# Patient Record
Sex: Female | Born: 1970 | Race: Black or African American | Hispanic: No | Smoking: Former smoker
Health system: Southern US, Community
[De-identification: ages and names within clinical notes are randomized; demographics above are authoritative.]

## PROBLEM LIST (undated history)

## (undated) DIAGNOSIS — D259 Leiomyoma of uterus, unspecified: Secondary | ICD-10-CM

## (undated) DIAGNOSIS — R911 Solitary pulmonary nodule: Secondary | ICD-10-CM

## (undated) HISTORY — DX: Leiomyoma of uterus, unspecified: D25.9

## (undated) HISTORY — PX: CHOLECYSTECTOMY: SHX55

## (undated) HISTORY — DX: Solitary pulmonary nodule: R91.1

---

## 2019-07-21 ENCOUNTER — Other Ambulatory Visit: Payer: Self-pay

## 2019-07-21 ENCOUNTER — Emergency Department (HOSPITAL_BASED_OUTPATIENT_CLINIC_OR_DEPARTMENT_OTHER)
Admission: EM | Admit: 2019-07-21 | Discharge: 2019-07-21 | Disposition: A | Discharge status: Home / Self Care | Payer: BC Managed Care – PPO | Attending: Emergency Medicine | Admitting: Emergency Medicine

## 2019-07-21 ENCOUNTER — Encounter (HOSPITAL_BASED_OUTPATIENT_CLINIC_OR_DEPARTMENT_OTHER): Payer: Self-pay | Admitting: *Deleted

## 2019-07-21 ENCOUNTER — Emergency Department (HOSPITAL_BASED_OUTPATIENT_CLINIC_OR_DEPARTMENT_OTHER): Payer: BC Managed Care – PPO

## 2019-07-21 DIAGNOSIS — K5900 Constipation, unspecified: Secondary | ICD-10-CM | POA: Insufficient documentation

## 2019-07-21 DIAGNOSIS — F17200 Nicotine dependence, unspecified, uncomplicated: Secondary | ICD-10-CM | POA: Diagnosis not present

## 2019-07-21 DIAGNOSIS — D259 Leiomyoma of uterus, unspecified: Secondary | ICD-10-CM

## 2019-07-21 DIAGNOSIS — R102 Pelvic and perineal pain: Secondary | ICD-10-CM | POA: Insufficient documentation

## 2019-07-21 DIAGNOSIS — R1030 Lower abdominal pain, unspecified: Secondary | ICD-10-CM

## 2019-07-21 DIAGNOSIS — R1031 Right lower quadrant pain: Secondary | ICD-10-CM | POA: Diagnosis not present

## 2019-07-21 LAB — URINALYSIS, ROUTINE W REFLEX MICROSCOPIC
Bilirubin Urine: NEGATIVE
Glucose, UA: NEGATIVE mg/dL
Ketones, ur: 15 mg/dL — AB
Leukocytes,Ua: NEGATIVE
Nitrite: NEGATIVE
Protein, ur: NEGATIVE mg/dL
Specific Gravity, Urine: 1.025 (ref 1.005–1.030)
pH: 7.5 (ref 5.0–8.0)

## 2019-07-21 LAB — COMPREHENSIVE METABOLIC PANEL
ALT: 19 U/L (ref 0–44)
AST: 19 U/L (ref 15–41)
Albumin: 4.4 g/dL (ref 3.5–5.0)
Alkaline Phosphatase: 78 U/L (ref 38–126)
Anion gap: 10 (ref 5–15)
BUN: 14 mg/dL (ref 6–20)
CO2: 24 mmol/L (ref 22–32)
Calcium: 10.2 mg/dL (ref 8.9–10.3)
Chloride: 104 mmol/L (ref 98–111)
Creatinine, Ser: 1.05 mg/dL — ABNORMAL HIGH (ref 0.44–1.00)
GFR calc Af Amer: >60 mL/min (ref >60)
GFR calc non Af Amer: >60 mL/min (ref >60)
Glucose, Bld: 101 mg/dL — ABNORMAL HIGH (ref 70–99)
Potassium: 3.9 mmol/L (ref 3.5–5.1)
Sodium: 138 mmol/L (ref 135–145)
Total Bilirubin: 0.6 mg/dL (ref 0.3–1.2)
Total Protein: 8.2 g/dL — ABNORMAL HIGH (ref 6.5–8.1)

## 2019-07-21 LAB — WET PREP, GENITAL
Sperm: NONE SEEN
Trich, Wet Prep: NONE SEEN
Yeast Wet Prep HPF POC: NONE SEEN

## 2019-07-21 LAB — PREGNANCY, URINE: Preg Test, Ur: NEGATIVE

## 2019-07-21 LAB — CBC WITH DIFFERENTIAL/PLATELET
Abs Immature Granulocytes: 0.02 10*3/uL (ref 0.00–0.07)
Basophils Absolute: 0 10*3/uL (ref 0.0–0.1)
Basophils Relative: 0 %
Eosinophils Absolute: 0 10*3/uL (ref 0.0–0.5)
Eosinophils Relative: 0 %
HCT: 42.9 % (ref 36.0–46.0)
Hemoglobin: 13.8 g/dL (ref 12.0–15.0)
Immature Granulocytes: 0 %
Lymphocytes Relative: 27 %
Lymphs Abs: 1.7 10*3/uL (ref 0.7–4.0)
MCH: 29.2 pg (ref 26.0–34.0)
MCHC: 32.2 g/dL (ref 30.0–36.0)
MCV: 90.9 fL (ref 80.0–100.0)
Monocytes Absolute: 0.6 10*3/uL (ref 0.1–1.0)
Monocytes Relative: 10 %
Neutro Abs: 3.9 10*3/uL (ref 1.7–7.7)
Neutrophils Relative %: 63 %
Platelets: 270 10*3/uL (ref 150–400)
RBC: 4.72 MIL/uL (ref 3.87–5.11)
RDW: 13.1 % (ref 11.5–15.5)
WBC: 6.3 10*3/uL (ref 4.0–10.5)
nRBC: 0 % (ref 0.0–0.2)

## 2019-07-21 LAB — URINALYSIS, MICROSCOPIC (REFLEX)

## 2019-07-21 LAB — LIPASE, BLOOD: Lipase: 34 U/L (ref 11–51)

## 2019-07-21 MED ORDER — IOHEXOL 300 MG/ML  SOLN
100.0000 mL | Freq: Once | INTRAMUSCULAR | Status: AC | PRN
  Administered 2019-07-21: 100 mL via INTRAVENOUS

## 2019-07-21 NOTE — ED Notes (Signed)
Pt verbalized understanding of dc instructions.

## 2019-07-21 NOTE — ED Provider Notes (Signed)
[facility] Provider Note   CSN: [id number] Arrival date & time: 07/21/19  1222     History   Chief Complaint Chief Complaint  Patient presents with   Abdominal Pain    HPI Labelle Tremonti Roza is a 48 y.o. female.     Nikala Nghiem Vasey is a 48 y.o. female who is otherwise healthy, presents to the emergency department for evaluation of lower abdominal pain.  She started having some lower abdominal cramping 3 days ago this was associated with constipation, she initially took Gas-X without relief, then took a laxative and was able to have a bowel movement but cramping has persisted and has now become worse on the right side.  She reports some associated nausea but no vomiting.  No fevers or chills.  No burning or pain with urination, hematuria or flank pain.  She does report that back in July she had her normal menstrual cycle starting on the 17th, this lasted about a week and then went off for a few days and then she had a second menstrual cycle.  She reports that today she started to feel like her menstrual cycle was going to come back on but she does not usually have associated cramping or pain like she has been having.  She denies any vaginal discharge.  Has an IUD in place.  History of cholecystectomy but no other previous abdominal surgeries.  No other aggravating or alleviating factors.     History reviewed. No pertinent past medical history.  There are no active problems to display for this patient.   Past Surgical History:  Procedure Laterality Date   CESAREAN SECTION     CHOLECYSTECTOMY       OB History   No obstetric history on file.      Home Medications    Prior to Admission medications   Not on File    Family History No family history on file.  Social History Social History   Tobacco Use   Smoking status: Current Every Day Smoker   Smokeless tobacco: Never Used  Substance Use Topics   Alcohol use: Yes    Frequency: Never    Drug use: Never     Allergies   Patient has no known allergies.   Review of Systems Review of Systems  Constitutional: Negative for chills and fever.  HENT: Negative.   Respiratory: Negative for cough and shortness of breath.   Cardiovascular: Negative for chest pain.  Gastrointestinal: Positive for abdominal pain, constipation and nausea. Negative for blood in stool, diarrhea and vomiting.  Genitourinary: Negative for dysuria, flank pain, frequency, hematuria, vaginal bleeding and vaginal discharge.  Musculoskeletal: Negative for arthralgias and myalgias.  Skin: Negative for color change and rash.  Neurological: Negative for dizziness, syncope and light-headedness.     Physical Exam Updated Vital Signs BP (!) 137/96 (BP Location: Left Arm)    Pulse 74    Temp 99.4 F (37.4 C) (Oral)    Resp 14    Ht 5\' 5"  (1.651 m)    Wt 82.9 kg    SpO2 97%    BMI 30.42 kg/m   Physical Exam Vitals signs and nursing note reviewed. Exam conducted with a chaperone present.  Constitutional:      General: She is not in acute distress.    Appearance: She is well-developed and normal weight. She is not ill-appearing or diaphoretic.  HENT:     Head: Normocephalic and atraumatic.     Mouth/Throat:  Mouth: Mucous membranes are moist.     Pharynx: Oropharynx is clear.  Eyes:     General:        Right eye: No discharge.        Left eye: No discharge.     Pupils: Pupils are equal, round, and reactive to light.  Neck:     Musculoskeletal: Neck supple.  Cardiovascular:     Rate and Rhythm: Normal rate and regular rhythm.     Heart sounds: Normal heart sounds.  Pulmonary:     Effort: Pulmonary effort is normal. No respiratory distress.     Breath sounds: Normal breath sounds. No wheezing or rales.     Comments: Respirations equal and unlabored, patient able to speak in full sentences, lungs clear to auscultation bilaterally Abdominal:     General: Abdomen is flat. Bowel sounds are normal.  There is no distension.     Palpations: Abdomen is soft. There is no mass.     Tenderness: There is abdominal tenderness in the right lower quadrant and suprapubic area. There is no guarding.     Comments: Abdomen is soft, nondistended, bowel sounds present throughout, there is some tenderness in the suprapubic and right lower quadrants without guarding or rebound tenderness, no CVA tenderness bilaterally.  Genitourinary:    Comments: Chaperone present during pelvic exam. No external genital lesions noted. Speculum exam reveals small amount of clear discharge and very small amount of cervical bleeding, no lesions. On bimanual exam patient has some discomfort, palpable uterine fibroids present over the uterus, there is some tenderness to the right to, the uterus appears to be flexed slightly to the right and again uterine fibroids are palpable, no left adnexal tenderness. Musculoskeletal:        General: No deformity.  Skin:    General: Skin is warm and dry.     Capillary Refill: Capillary refill takes less than 2 seconds.  Neurological:     Mental Status: She is alert.     Coordination: Coordination normal.     Comments: Speech is clear, able to follow commands Moves extremities without ataxia, coordination intact  Psychiatric:        Mood and Affect: Mood normal.        Behavior: Behavior normal.      ED Treatments / Results  Labs (all labs ordered are listed, but only abnormal results are displayed) Labs Reviewed  URINALYSIS, ROUTINE W REFLEX MICROSCOPIC - Abnormal; Notable for the following components:      Result Value   Hgb urine dipstick LARGE (*)    Ketones, ur 15 (*)    All other components within normal limits  COMPREHENSIVE METABOLIC PANEL - Abnormal; Notable for the following components:   Glucose, Bld 101 (*)    Creatinine, Ser 1.05 (*)    Total Protein 8.2 (*)    All other components within normal limits  URINALYSIS, MICROSCOPIC (REFLEX) - Abnormal; Notable for  the following components:   Bacteria, UA FEW (*)    All other components within normal limits  WET PREP, GENITAL  LIPASE, BLOOD  CBC WITH DIFFERENTIAL/PLATELET  PREGNANCY, URINE    EKG None  Radiology Ct Abdomen Pelvis W Contrast  Result Date: 07/21/2019 CLINICAL DATA:  Abdominal pain with nausea EXAM: CT ABDOMEN AND PELVIS WITH CONTRAST TECHNIQUE: Multidetector CT imaging of the abdomen and pelvis was performed using the standard protocol following bolus administration of intravenous contrast. CONTRAST:  131mL OMNIPAQUE IOHEXOL 300 MG/ML  SOLN COMPARISON:  None.  FINDINGS: Lower chest: There is mild bibasilar atelectasis. There is a nodular opacity in the lateral segment of the left lower lobe abutting the pleura measuring 3 mm. This nodular opacity is seen on axial slice 9 series 3. Hepatobiliary: No focal liver lesions are apparent. Gallbladder is absent. There is no biliary duct dilatation. Pancreas: No pancreatic mass or inflammatory focus. Spleen: No splenic lesions are evident. Adrenals/Urinary Tract: Adrenals bilaterally appear normal. Kidneys bilaterally show no evident mass or hydronephrosis on either side. There is no evident renal or ureteral calculus on either side. Urinary bladder is midline with wall thickness within normal limits. Stomach/Bowel: There is no appreciable bowel wall or mesenteric thickening. No evident bowel obstruction. Terminal ileum appears normal. There is no evident free air or portal venous air. Vascular/Lymphatic: There is no abdominal aortic aneurysm. No vascular lesions are evident. There is a circumaortic left renal vein, an anatomic variant. There is no adenopathy in the abdomen or pelvis. Reproductive: The uterus is anteverted. There is an intrauterine device within the endometrium. There are multiple leiomyomas throughout the uterus, largest measuring 6.7 x 5.5 cm. No extrauterine pelvic masses are demonstrable. There is a physiologic follicle in the left  ovary measuring 1.2 x 1.2 cm. Other: Appendix appears normal. No evident abscess or ascites in the abdomen or pelvis. Musculoskeletal: There are no blastic or lytic bone lesions. No intramuscular or abdominal wall lesions are evident. IMPRESSION: 1. Enlarged leiomyomatous uterus. Intrauterine device positioned within the endometrium. Multiple leiomyomas are impressing upon the intrauterine device and endometrium. 2. Appendix appears normal. No abscess in the abdomen or pelvis. No bowel obstruction. 3. No renal or ureteral calculus. No hydronephrosis. Urinary bladder wall thickness is within normal limits. 4.  Gallbladder absent. 5. 3 mm nodular opacity in left lower lobe. No follow-up needed if patient is low-risk. Non-contrast chest CT can be considered in 12 months if patient is high-risk. This recommendation follows the consensus statement: Guidelines for Management of Incidental Pulmonary Nodules Detected on CT Images: From the Fleischner Society 2017; Radiology 2017; 284:228-243. Electronically Signed   By: Lowella Grip III M.D.   On: 07/21/2019 13:49    Procedures Procedures (including critical care time)  Medications Ordered in ED Medications  iohexol (OMNIPAQUE) 300 MG/ML solution 100 mL (100 mLs Intravenous Contrast Given 07/21/19 1327)     Initial Impression / Assessment and Plan / ED Course  I have reviewed the triage vital signs and the nursing notes.  Pertinent labs & imaging results that were available during my care of the patient were reviewed by me and considered in my medical decision making (see chart for details).  48 year old female presents with lower abdominal pain over the past 4 days.  Not improving after relieved constipation.  She reports that today she feels like her menstrual cycle is about to start over, had some abnormal uterine bleeding in July.  No fevers, normal vitals and well-appearing.  On exam she does have some suprapubic and right lower quadrant  tenderness.  No associated urinary symptoms.  Will get abdominal labs, urinalysis and CT abdomen pelvis, differential includes appendicitis, diverticulitis, urinary tract infection, uterine fibroids.  Patient reports no concern for STDs, will collect wet prep with pelvic exam.  Labs overall very reassuring, no leukocytosis, normal hemoglobin, creatinine of 1.05, no other concerning electrolyte derangements, normal renal function and lipase.  Urinalysis with some hemoglobin present, but no signs of urinary tract infection, negative pregnancy.  On pelvic exam palpable uterine fibroids with tenderness, small  amount of bleeding but no discharge or odor.  Wet prep with few WBCs and some clue cells present but patient is not symptomatic with this and do not think it is indicated to treat for BV at this time.  CT abdomen pelvis shows enlarged leiomyomatous uterus with multiple fibroids noted the largest measuring 6.7 x 5.5 cm.  Normal appendix, no evidence of cholecystitis.  There is a 3 mm pulmonary nodule noted I discussed this with the patient, she will follow-up with PCP for repeat CT in 12 months.  Encouraged Motrin and Tylenol for help with pain from uterine fibroids.  Return precautions discussed.  Patient has follow-up appointment with OB/GYN already scheduled.  Return precautions discussed, pt expresses understanding and agrees with plan.    Final Clinical Impressions(s) / ED Diagnoses   Final diagnoses:  Uterine leiomyoma, unspecified location  Lower abdominal pain    ED Discharge Orders    None       Jacqlyn Larsen, [place] 07/21/19 1443    Maudie Flakes, MD 07/21/19 [phone]

## 2019-07-21 NOTE — ED Triage Notes (Signed)
3 days ago she had lower abdominal pressure that felt like gas. She took a laxative with BM but pain continues. She has an IUD. The pain she has today feels like she is going to start her menses.

## 2019-07-21 NOTE — Discharge Instructions (Signed)
Your work-up today shows multiple uterine fibroids, which is likely the cause of your pain.  These can also cause abnormal uterine bleeding.  Your CT otherwise looks good, appendix is normal.  Labs look good.  Use Motrin and Tylenol as needed for pain and follow-up with your OB/GYN as planned.  Return to the ED for new or worsening abdominal pain, fevers, persistent heavy vaginal bleeding or any other new or concerning symptoms.  Your CT did show a 3 mm pulmonary nodule in the left lower lobe of your lung, we see these very commonly, you should discuss this with your primary doctor for follow-up in 12 months if necessary.

## 2019-08-09 DIAGNOSIS — Z6831 Body mass index (BMI) 31.0-31.9, adult: Secondary | ICD-10-CM | POA: Diagnosis not present

## 2019-08-09 DIAGNOSIS — N939 Abnormal uterine and vaginal bleeding, unspecified: Secondary | ICD-10-CM | POA: Diagnosis not present

## 2019-08-16 DIAGNOSIS — N938 Other specified abnormal uterine and vaginal bleeding: Secondary | ICD-10-CM | POA: Diagnosis not present

## 2019-08-16 DIAGNOSIS — Z6831 Body mass index (BMI) 31.0-31.9, adult: Secondary | ICD-10-CM | POA: Diagnosis not present

## 2020-02-21 ENCOUNTER — Encounter: Payer: Self-pay | Admitting: Medical

## 2020-02-21 ENCOUNTER — Other Ambulatory Visit: Payer: Self-pay

## 2020-02-21 ENCOUNTER — Ambulatory Visit (INDEPENDENT_AMBULATORY_CARE_PROVIDER_SITE_OTHER): Payer: BC Managed Care – PPO | Admitting: Medical

## 2020-02-21 VITALS — BP 131/71 | HR 99 | Temp 97.0°F | Resp 18 | Ht 65.0 in | Wt 197.6 lb

## 2020-02-21 DIAGNOSIS — Z0000 Encounter for general adult medical examination without abnormal findings: Secondary | ICD-10-CM | POA: Diagnosis not present

## 2020-02-21 DIAGNOSIS — R911 Solitary pulmonary nodule: Secondary | ICD-10-CM | POA: Diagnosis not present

## 2020-02-21 DIAGNOSIS — Z113 Encounter for screening for infections with a predominantly sexual mode of transmission: Secondary | ICD-10-CM

## 2020-02-21 DIAGNOSIS — Z1231 Encounter for screening mammogram for malignant neoplasm of breast: Secondary | ICD-10-CM

## 2020-02-21 DIAGNOSIS — Z1211 Encounter for screening for malignant neoplasm of colon: Secondary | ICD-10-CM

## 2020-02-21 LAB — CBC WITH DIFFERENTIAL/PLATELET
Basophils Absolute: 0 10*3/uL (ref 0.0–0.1)
Basophils Relative: 0.5 % (ref 0.0–3.0)
Eosinophils Absolute: 0 10*3/uL (ref 0.0–0.7)
Eosinophils Relative: 1.2 % (ref 0.0–5.0)
HCT: 38.5 % (ref 36.0–46.0)
Hemoglobin: 12.7 g/dL (ref 12.0–15.0)
Lymphocytes Relative: 42.7 % (ref 12.0–46.0)
Lymphs Abs: 1.7 10*3/uL (ref 0.7–4.0)
MCHC: 33.1 g/dL (ref 30.0–36.0)
MCV: 90.1 fl (ref 78.0–100.0)
Monocytes Absolute: 0.3 10*3/uL (ref 0.1–1.0)
Monocytes Relative: 8.5 % (ref 3.0–12.0)
Neutro Abs: 1.9 10*3/uL (ref 1.4–7.7)
Neutrophils Relative %: 47.1 % (ref 43.0–77.0)
Platelets: 273 10*3/uL (ref 150.0–400.0)
RBC: 4.28 Mil/uL (ref 3.87–5.11)
RDW: 13.1 % (ref 11.5–15.5)
WBC: 3.9 10*3/uL — ABNORMAL LOW (ref 4.0–10.5)

## 2020-02-21 LAB — LIPID PANEL
Cholesterol: 181 mg/dL (ref 0–200)
HDL: 87.7 mg/dL (ref >39.00)
LDL Cholesterol: 88 mg/dL (ref 0–99)
NonHDL: 93.74
Total CHOL/HDL Ratio: 2
Triglycerides: 30 mg/dL (ref 0.0–149.0)
VLDL: 6 mg/dL (ref 0.0–40.0)

## 2020-02-21 LAB — COMPREHENSIVE METABOLIC PANEL
ALT: 15 U/L (ref 0–35)
AST: 20 U/L (ref 0–37)
Albumin: 4.2 g/dL (ref 3.5–5.2)
Alkaline Phosphatase: 78 U/L (ref 39–117)
BUN: 12 mg/dL (ref 6–23)
CO2: 30 mEq/L (ref 19–32)
Calcium: 10.4 mg/dL (ref 8.4–10.5)
Chloride: 105 mEq/L (ref 96–112)
Creatinine, Ser: 0.86 mg/dL (ref 0.40–1.20)
GFR: 84.36 mL/min (ref >60.00)
Glucose, Bld: 92 mg/dL (ref 70–99)
Potassium: 4.5 mEq/L (ref 3.5–5.1)
Sodium: 136 mEq/L (ref 135–145)
Total Bilirubin: 0.5 mg/dL (ref 0.2–1.2)
Total Protein: 6.9 g/dL (ref 6.0–8.3)

## 2020-02-21 NOTE — Patient Instructions (Addendum)
For you wellness exam today I have ordered cbc, cmp, lipid panel and hiv.  Pt thinks tdap up to date and plans on getting covid vaccine.  Recommend exercise and healthy diet.  We will let you know lab results as they come in.  Follow up date appointment will be determined after lab review.   For hx of pulmonary nodule do recommend repeat ct chest in august based on smoking hx.  Referral for mammogram and colonoscopy placed.    Preventive Care 63-97 Years Old, Female Preventive care refers to visits with your health care provider and lifestyle choices that can promote health and wellness. This includes:  A yearly physical exam. This may also be called an annual well check.  Regular dental visits and eye exams.  Immunizations.  Screening for certain conditions.  Healthy lifestyle choices, such as eating a healthy diet, getting regular exercise, not using drugs or products that contain nicotine and tobacco, and limiting alcohol use. What can I expect for my preventive care visit? Physical exam Your health care provider will check your:  Height and weight. This may be used to calculate body mass index (BMI), which tells if you are at a healthy weight.  Heart rate and blood pressure.  Skin for abnormal spots. Counseling Your health care provider may ask you questions about your:  Alcohol, tobacco, and drug use.  Emotional well-being.  Home and relationship well-being.  Sexual activity.  Eating habits.  Work and work Statistician.  Method of birth control.  Menstrual cycle.  Pregnancy history. What immunizations do I need?  Influenza (flu) vaccine  This is recommended every year. Tetanus, diphtheria, and pertussis (Tdap) vaccine  You may need a Td booster every 10 years. Varicella (chickenpox) vaccine  You may need this if you have not been vaccinated. Zoster (shingles) vaccine  You may need this after age 65. Measles, mumps, and rubella (MMR)  vaccine  You may need at least one dose of MMR if you were born in 1957 or later. You may also need a second dose. Pneumococcal conjugate (PCV13) vaccine  You may need this if you have certain conditions and were not previously vaccinated. Pneumococcal polysaccharide (PPSV23) vaccine  You may need one or two doses if you smoke cigarettes or if you have certain conditions. Meningococcal conjugate (MenACWY) vaccine  You may need this if you have certain conditions. Hepatitis A vaccine  You may need this if you have certain conditions or if you travel or work in places where you may be exposed to hepatitis A. Hepatitis B vaccine  You may need this if you have certain conditions or if you travel or work in places where you may be exposed to hepatitis B. Haemophilus influenzae type b (Hib) vaccine  You may need this if you have certain conditions. Human papillomavirus (HPV) vaccine  If recommended by your health care provider, you may need three doses over 6 months. You may receive vaccines as individual doses or as more than one vaccine together in one shot (combination vaccines). Talk with your health care provider about the risks and benefits of combination vaccines. What tests do I need? Blood tests  Lipid and cholesterol levels. These may be checked every 5 years, or more frequently if you are over 50 years old.  Hepatitis C test.  Hepatitis B test. Screening  Lung cancer screening. You may have this screening every year starting at age 60 if you have a 30-pack-year history of smoking and currently smoke or  have quit within the past 15 years.  Colorectal cancer screening. All adults should have this screening starting at age 51 and continuing until age 59. Your health care provider may recommend screening at age 5 if you are at increased risk. You will have tests every 1-10 years, depending on your results and the type of screening test.  Diabetes screening. This is done by  checking your blood sugar (glucose) after you have not eaten for a while (fasting). You may have this done every 1-3 years.  Mammogram. This may be done every 1-2 years. Talk with your health care provider about when you should start having regular mammograms. This may depend on whether you have a family history of breast cancer.  BRCA-related cancer screening. This may be done if you have a family history of breast, ovarian, tubal, or peritoneal cancers.  Pelvic exam and Pap test. This may be done every 3 years starting at age 76. Starting at age 30, this may be done every 5 years if you have a Pap test in combination with an HPV test. Other tests  Sexually transmitted disease (STD) testing.  Bone density scan. This is done to screen for osteoporosis. You may have this scan if you are at high risk for osteoporosis. Follow these instructions at home: Eating and drinking  Eat a diet that includes fresh fruits and vegetables, whole grains, lean protein, and low-fat dairy.  Take vitamin and mineral supplements as recommended by your health care provider.  Do not drink alcohol if: ? Your health care provider tells you not to drink. ? You are pregnant, may be pregnant, or are planning to become pregnant.  If you drink alcohol: ? Limit how much you have to 0-1 drink a day. ? Be aware of how much alcohol is in your drink. In the [place] one drink equals one 12 oz bottle of beer (355 mL), one 5 oz glass of wine (148 mL), or one 1 oz glass of hard liquor (44 mL). Lifestyle  Take daily care of your teeth and gums.  Stay active. Exercise for at least 30 minutes on 5 or more days each week.  Do not use any products that contain nicotine or tobacco, such as cigarettes, e-cigarettes, and chewing tobacco. If you need help quitting, ask your health care provider.  If you are sexually active, practice safe sex. Use a condom or other form of birth control (contraception) in order to prevent pregnancy  and STIs (sexually transmitted infections).  If told by your health care provider, take low-dose aspirin daily starting at age 4. What's next?  Visit your health care provider once a year for a well check visit.  Ask your health care provider how often you should have your eyes and teeth checked.  Stay up to date on all vaccines. This information is not intended to replace advice given to you by your health care provider. Make sure you discuss any questions you have with your health care provider. Document Revised: 07/29/2018 Document Reviewed: 07/29/2018 Elsevier Patient Education  2020 [employer].

## 2020-02-21 NOTE — Progress Notes (Signed)
Subjective:    Patient ID: Alison Jackson, female    DOB: 12/15/1970, 49 y.o.   MRN: [id number]  HPI   Pt in for first time.   Pt moved from [place] about one year ago. Pt has gynecologist in [place].  Pt does not exercise on regular basis, recently has improved her diet. 1 soda usually at dinner or glass of wine if no soda. Non smoker but hx of.     Pt has history of uterine fibroids. Followed by gyn.  She has hx of lung nodule found incidentally 07-21-2019. Pt states smoked smoked 15-20 years off and on. She stopped completely about one year ago. No cough reported. No sob, no dyspnea and no hemoptysis.    Pt never had colonoscopy.  Pt states not up to date on mammogram   Review of Systems  Constitutional: Negative for chills, fatigue and fever.  HENT: Negative for congestion, ear discharge, ear pain and postnasal drip.   Respiratory: Negative for apnea, cough, choking, shortness of breath and wheezing.   Cardiovascular: Negative for chest pain and palpitations.  Gastrointestinal: Negative for abdominal pain.  Genitourinary: Negative for dyspareunia, dysuria and urgency.  Musculoskeletal: Negative for back pain, myalgias, neck pain and neck stiffness.  Skin: Negative for rash.  Neurological: Negative for dizziness, speech difficulty, weakness and light-headedness.  Hematological: Negative for adenopathy. Does not bruise/bleed easily.  Psychiatric/Behavioral: Negative for behavioral problems, decreased concentration, dysphoric mood, sleep disturbance and suicidal ideas. The patient is not nervous/anxious.     No past medical history on file.   Social History   Socioeconomic History  . Marital status: Unknown    Spouse name: Not on file  . Number of children: Not on file  . Years of education: Not on file  . Highest education level: Not on file  Occupational History  . Not on file  Tobacco Use  . Smoking status: Current Every Day Smoker  . Smokeless tobacco: Never  Used  Substance and Sexual Activity  . Alcohol use: Yes  . Drug use: Never  . Sexual activity: Not on file  Other Topics Concern  . Not on file  Social History Narrative  . Not on file   Social Determinants of Health   Financial Resource Strain:   . Difficulty of Paying Living Expenses:   Food Insecurity:   . Worried About Charity fundraiser in the Last Year:   . Arboriculturist in the Last Year:   Transportation Needs:   . Film/video editor (Medical):   Marland Kitchen Lack of Transportation (Non-Medical):   Physical Activity:   . Days of Exercise per Week:   . Minutes of Exercise per Session:   Stress:   . Feeling of Stress :   Social Connections:   . Frequency of Communication with Friends and Family:   . Frequency of Social Gatherings with Friends and Family:   . Attends Religious Services:   . Active Member of Clubs or Organizations:   . Attends Archivist Meetings:   Marland Kitchen Marital Status:   Intimate Partner Violence:   . Fear of Current or Ex-Partner:   . Emotionally Abused:   Marland Kitchen Physically Abused:   . Sexually Abused:     Past Surgical History:  Procedure Laterality Date  . CESAREAN SECTION    . CHOLECYSTECTOMY      No family history on file.  No Known Allergies  No current outpatient medications on file prior to visit.  No current facility-administered medications on file prior to visit.    BP 131/71 (BP Location: Left Arm, Patient Position: Sitting, Cuff Size: Large)   Pulse 99   Temp (!) 97 F (36.1 C) (Temporal)   Resp 18   Ht 5\' 5"  (1.651 m)   Wt 197 lb 9.6 oz (89.6 kg)   SpO2 (!) 71%   BMI 32.88 kg/m       Objective:   Physical Exam  General Mental Status- Alert. General Appearance- Not in acute distress.   Skin General: Color- Normal Color. Moisture- Normal Moisture.  Neck Carotid Arteries- Normal color. Moisture- Normal Moisture. No carotid bruits. No JVD.  Chest and Lung Exam Auscultation: Breath  Sounds:-Normal.  Cardiovascular Auscultation:Rythm- Regular. Murmurs & Other Heart Sounds:Auscultation of the heart reveals- No Murmurs.  Abdomen Inspection:-Inspeection Normal. Palpation/Percussion:Note:No mass. Palpation and Percussion of the abdomen reveal- Non Tender, Non Distended + BS, no rebound or guarding.    Neurologic Cranial Nerve exam:- CN III-XII intact(No nystagmus), symmetric smile. Strength:- 5/5 equal and symmetric strength both upper and lower extremities.      Assessment & Plan:  For you wellness exam today I have ordered cbc, cmp, lipid panel and hiv.  Pt thinks tdap up to date and plans on getting covid vaccine.  Recommend exercise and healthy diet.  We will let you know lab results as they come in.  Follow up date appointment will be determined after lab review.   For hx of pulmonary nodule do recommend repeat ct chest in august based on smoking hx.  Referral for mammogram and colonoscopy placed.    Mackie Pai, PA-C

## 2020-02-22 LAB — HIV ANTIBODY (ROUTINE TESTING W REFLEX): HIV 1&2 Ab, 4th Generation: NONREACTIVE

## 2020-03-02 ENCOUNTER — Ambulatory Visit (HOSPITAL_BASED_OUTPATIENT_CLINIC_OR_DEPARTMENT_OTHER)
Admission: RE | Admit: 2020-03-02 | Discharge: 2020-03-02 | Disposition: A | Discharge status: Home / Self Care | Payer: BC Managed Care – PPO | Source: Ambulatory Visit | Attending: Medical | Admitting: Medical

## 2020-03-02 ENCOUNTER — Other Ambulatory Visit: Payer: Self-pay

## 2020-03-02 DIAGNOSIS — Z1231 Encounter for screening mammogram for malignant neoplasm of breast: Secondary | ICD-10-CM | POA: Diagnosis not present

## 2020-03-03 ENCOUNTER — Telehealth: Payer: Self-pay | Admitting: Medical

## 2020-03-03 DIAGNOSIS — N6489 Other specified disorders of breast: Secondary | ICD-10-CM

## 2020-03-03 NOTE — Telephone Encounter (Signed)
Diagnostic mammogram of left breast placed per radiologist recommendations.

## 2020-03-05 ENCOUNTER — Other Ambulatory Visit: Payer: Self-pay | Admitting: Medical

## 2020-03-05 DIAGNOSIS — N6489 Other specified disorders of breast: Secondary | ICD-10-CM

## 2020-03-20 ENCOUNTER — Ambulatory Visit: Admission: RE | Admit: 2020-03-20 | Payer: BC Managed Care – PPO | Source: Ambulatory Visit

## 2020-03-20 ENCOUNTER — Ambulatory Visit
Admission: RE | Admit: 2020-03-20 | Discharge: 2020-03-20 | Disposition: A | Discharge status: Home / Self Care | Payer: BC Managed Care – PPO | Source: Ambulatory Visit | Attending: Medical | Admitting: Medical

## 2020-03-20 ENCOUNTER — Other Ambulatory Visit: Payer: Self-pay

## 2020-03-20 DIAGNOSIS — N6489 Other specified disorders of breast: Secondary | ICD-10-CM

## 2020-03-20 DIAGNOSIS — R928 Other abnormal and inconclusive findings on diagnostic imaging of breast: Secondary | ICD-10-CM | POA: Diagnosis not present

## 2020-04-26 ENCOUNTER — Encounter: Payer: Self-pay | Admitting: Medical

## 2020-11-24 IMAGING — MG MM DIGITAL DIAGNOSTIC UNILAT*L* W/ TOMO W/ CAD
6 series · 6 of 18 positions shown · non-contrast
Comparison: Previous exam(s).

CLINICAL DATA: 48-year-old female recalled from screening mammogram
dated 03/02/2020 for a possible left breast asymmetry.

EXAM:
DIGITAL DIAGNOSTIC UNILATERAL LEFT MAMMOGRAM WITH CAD AND TOMO

[L CC synth-2D (1 of 2)]
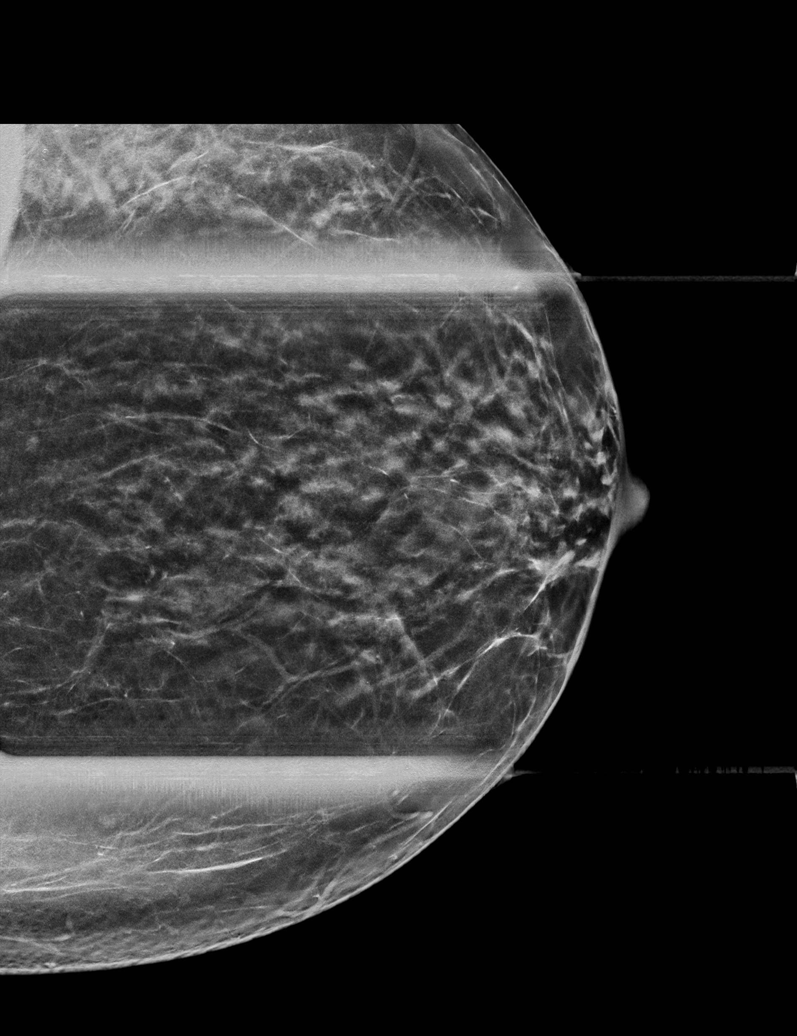

[L MLO synth-2D]
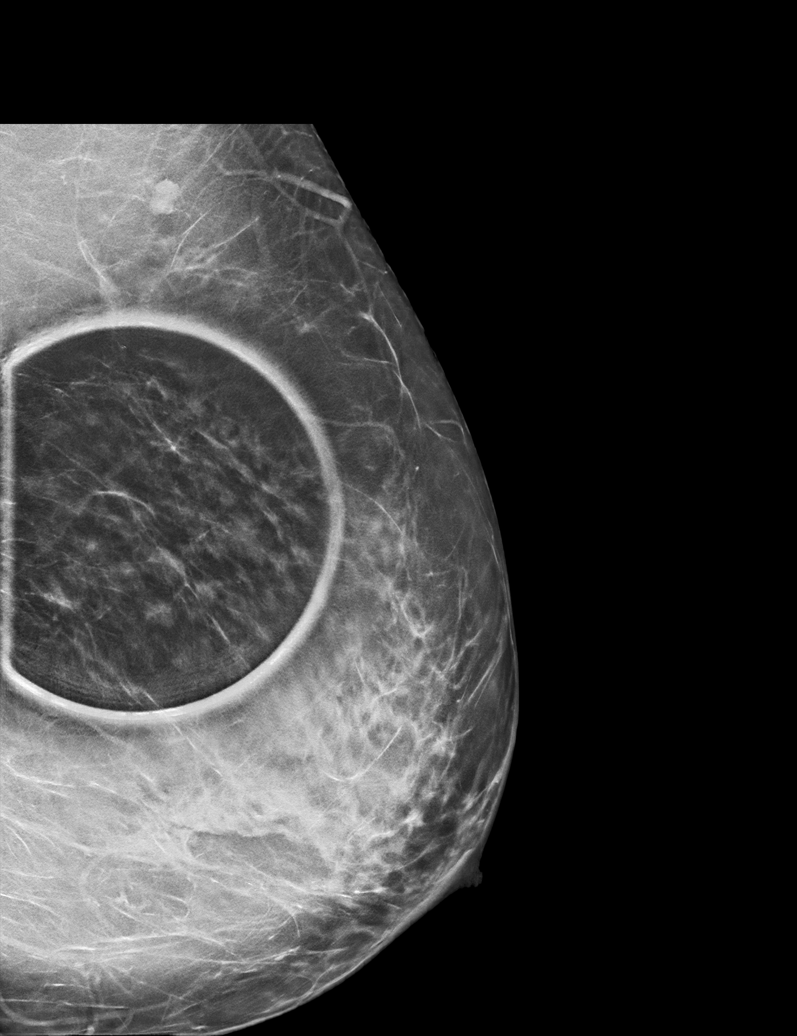

[L CC synth-2D (2 of 2)]
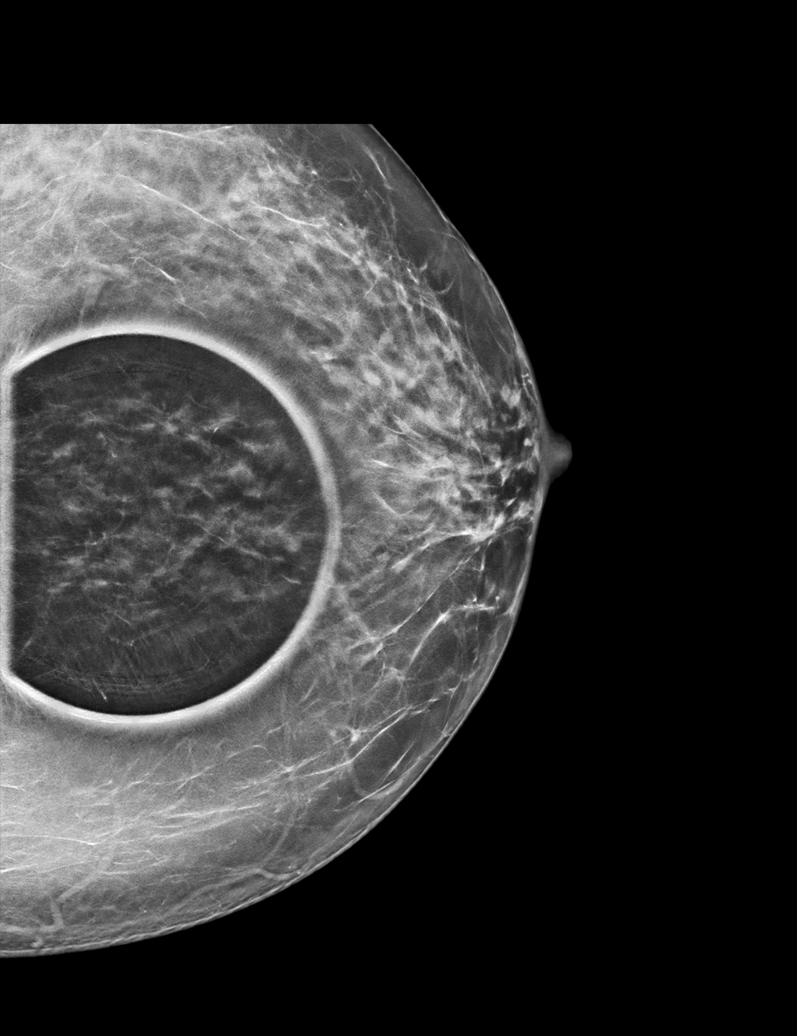

[L CC tomo (1 of 2) · tomo slice 28/55.0]
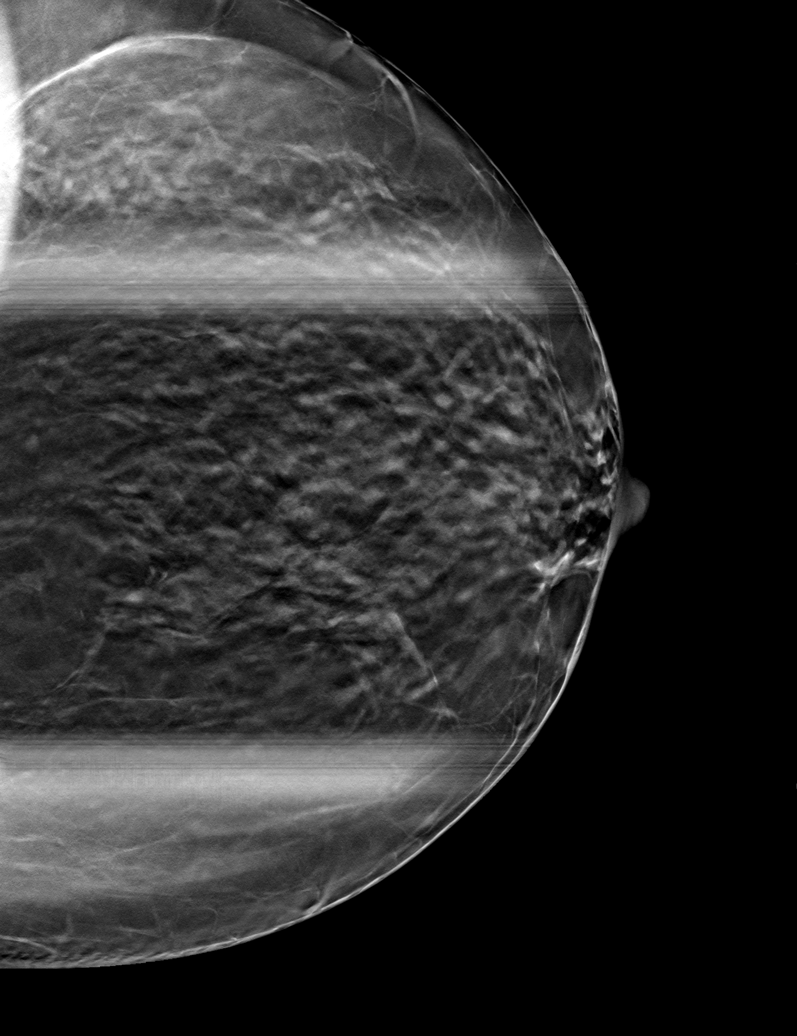

[L CC tomo (2 of 2) · tomo slice 29/57.0]
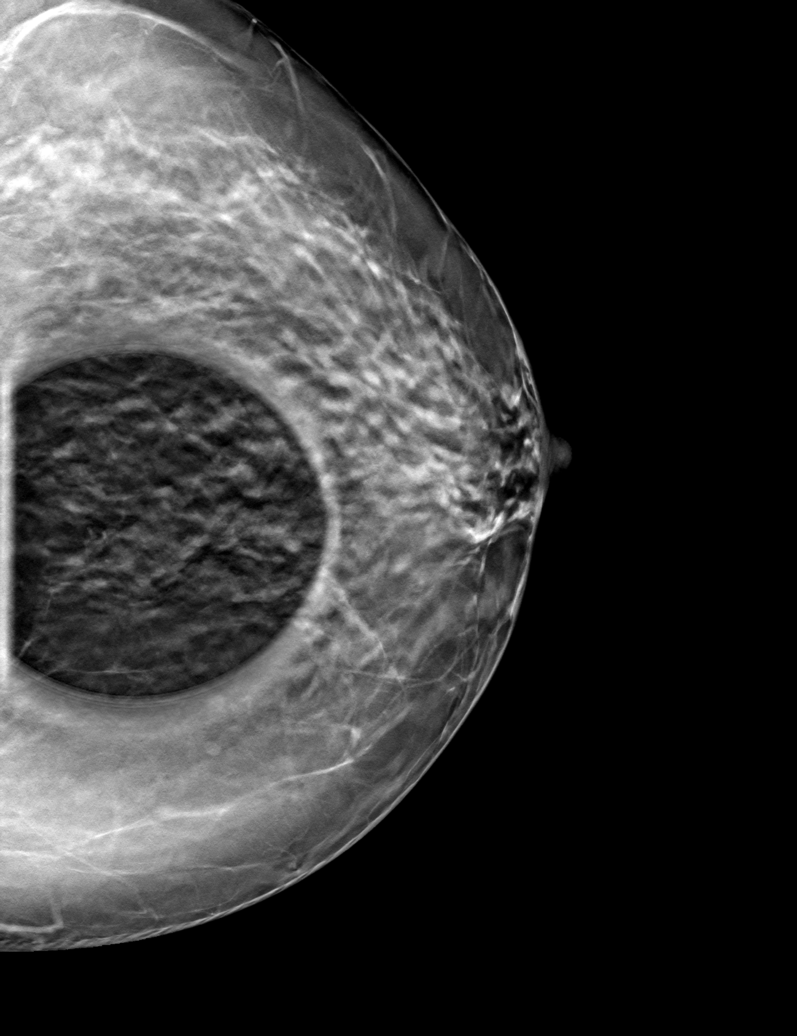

[L MLO tomo · tomo slice 32/63.0]
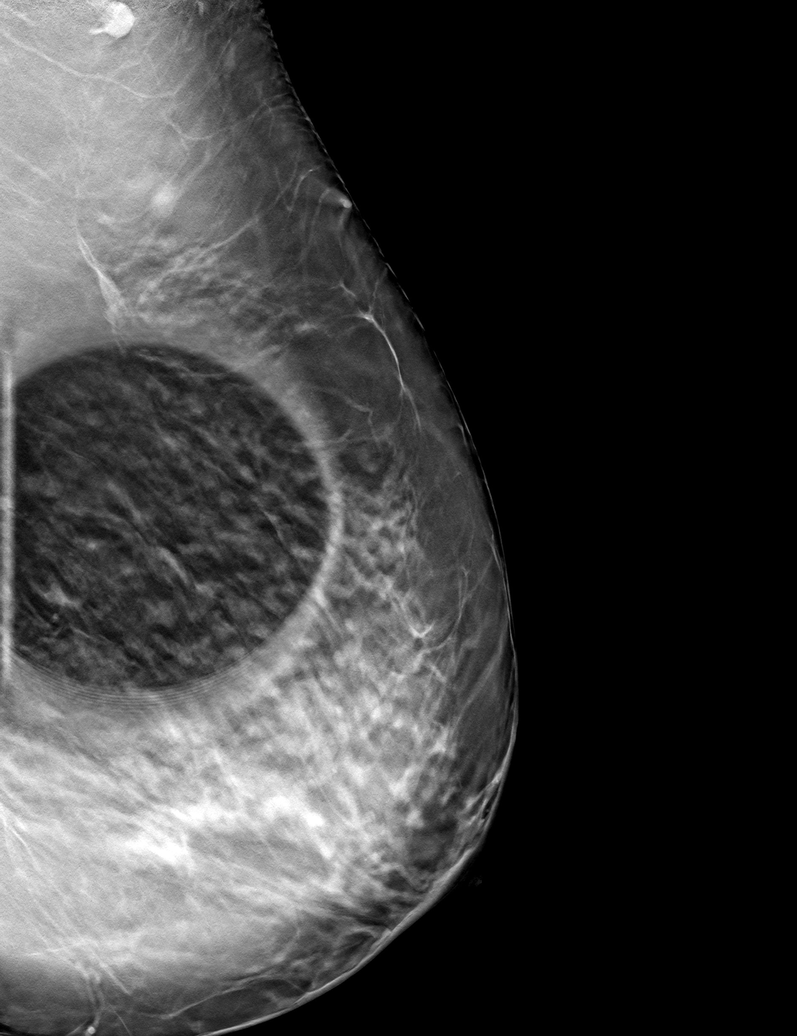

[6 of 18 positions shown; findings below may reference images not displayed]

ACR Breast Density Category b: There are scattered areas of
fibroglandular density.
FINDINGS: The previously described, possible asymmetry in the upper inner left
breast at middle depth resolves into well dispersed fibroglandular
tissue on today's additional views. The appearance is stable from
multiple remote mammograms, which have since become available for
comparison. No suspicious findings are identified.

Mammographic images were processed with CAD.
IMPRESSION: No mammographic evidence of malignancy.

RECOMMENDATION:
Screening mammogram in one year.(Code:[id number])

I have discussed the findings and recommendations with the patient.
If applicable, a reminder letter will be sent to the patient
regarding the next appointment.

BI-RADS CATEGORY  1: Negative.
